# Patient Record
Sex: Female | Born: 1975 | Race: Black or African American | Hispanic: No | Smoking: Never smoker
Health system: Southern US, Community
[De-identification: ages and names within clinical notes are randomized; demographics above are authoritative.]

## PROBLEM LIST (undated history)

## (undated) DIAGNOSIS — R569 Unspecified convulsions: Secondary | ICD-10-CM

---

## 2017-09-24 ENCOUNTER — Emergency Department (HOSPITAL_COMMUNITY)
Admission: EM | Admit: 2017-09-24 | Discharge: 2017-09-24 | Disposition: A | Discharge status: Home / Self Care | Payer: Medicaid Other | Attending: Emergency Medicine | Admitting: Emergency Medicine

## 2017-09-24 ENCOUNTER — Other Ambulatory Visit: Payer: Self-pay

## 2017-09-24 ENCOUNTER — Emergency Department (HOSPITAL_COMMUNITY): Payer: Medicaid Other

## 2017-09-24 ENCOUNTER — Encounter (HOSPITAL_COMMUNITY): Payer: Self-pay | Admitting: Nurse Practitioner

## 2017-09-24 DIAGNOSIS — G40909 Epilepsy, unspecified, not intractable, without status epilepticus: Secondary | ICD-10-CM | POA: Diagnosis not present

## 2017-09-24 DIAGNOSIS — Z9114 Patient's other noncompliance with medication regimen: Secondary | ICD-10-CM

## 2017-09-24 DIAGNOSIS — R569 Unspecified convulsions: Secondary | ICD-10-CM | POA: Insufficient documentation

## 2017-09-24 DIAGNOSIS — R404 Transient alteration of awareness: Secondary | ICD-10-CM | POA: Diagnosis not present

## 2017-09-24 HISTORY — DX: Unspecified convulsions: R56.9

## 2017-09-24 LAB — BASIC METABOLIC PANEL
Anion gap: 11 (ref 5–15)
BUN: 11 mg/dL (ref 6–20)
CHLORIDE: 106 mmol/L (ref 101–111)
CO2: 21 mmol/L — ABNORMAL LOW (ref 22–32)
CREATININE: 0.86 mg/dL (ref 0.44–1.00)
Calcium: 9.2 mg/dL (ref 8.9–10.3)
GFR calc non Af Amer: >60 mL/min (ref >60)
Glucose, Bld: 118 mg/dL — ABNORMAL HIGH (ref 65–99)
POTASSIUM: 4 mmol/L (ref 3.5–5.1)
SODIUM: 138 mmol/L (ref 135–145)

## 2017-09-24 LAB — CBC WITH DIFFERENTIAL/PLATELET
BASOS PCT: 0 %
Basophils Absolute: 0 10*3/uL (ref 0.0–0.1)
EOS ABS: 0 10*3/uL (ref 0.0–0.7)
Eosinophils Relative: 1 %
HCT: 36.9 % (ref 36.0–46.0)
HEMOGLOBIN: 12.3 g/dL (ref 12.0–15.0)
Lymphocytes Relative: 25 %
Lymphs Abs: 1.4 10*3/uL (ref 0.7–4.0)
MCH: 32.9 pg (ref 26.0–34.0)
MCHC: 33.3 g/dL (ref 30.0–36.0)
MCV: 98.7 fL (ref 78.0–100.0)
Monocytes Absolute: 0.3 10*3/uL (ref 0.1–1.0)
Monocytes Relative: 5 %
NEUTROS PCT: 69 %
Neutro Abs: 4 10*3/uL (ref 1.7–7.7)
Platelets: 195 10*3/uL (ref 150–400)
RBC: 3.74 MIL/uL — AB (ref 3.87–5.11)
RDW: 12.7 % (ref 11.5–15.5)
WBC: 5.7 10*3/uL (ref 4.0–10.5)

## 2017-09-24 LAB — I-STAT BETA HCG BLOOD, ED (MC, WL, AP ONLY): I-stat hCG, quantitative: <5 m[IU]/mL (ref <5)

## 2017-09-24 LAB — CBG MONITORING, ED
Glucose-Capillary: 84 mg/dL (ref 65–99)
Glucose-Capillary: 109 mg/dL — ABNORMAL HIGH (ref 65–99)

## 2017-09-24 LAB — ETHANOL: Alcohol, Ethyl (B): <10 mg/dL (ref <10)

## 2017-09-24 LAB — AMMONIA: AMMONIA: 19 umol/L (ref 9–35)

## 2017-09-24 MED ORDER — LEVETIRACETAM 500 MG PO TABS: 1000.0000 mg | ORAL_TABLET | Freq: Once | ORAL | Status: DC

## 2017-09-24 MED ORDER — GADOBENATE DIMEGLUMINE 529 MG/ML IV SOLN
15.0000 mL | Freq: Once | INTRAVENOUS | Status: AC | PRN
  Administered 2017-09-24: 15 mL via INTRAVENOUS

## 2017-09-24 MED ORDER — LORAZEPAM 2 MG/ML IJ SOLN
1.0000 mg | Freq: Once | INTRAMUSCULAR | Status: AC
  Administered 2017-09-24: 1 mg via INTRAMUSCULAR
  Filled 2017-09-24: qty 1

## 2017-09-24 MED ORDER — SODIUM CHLORIDE 0.9 % IV SOLN
1000.0000 mg | Freq: Once | INTRAVENOUS | Status: AC
  Administered 2017-09-24: 1000 mg via INTRAVENOUS
  Filled 2017-09-24: qty 10

## 2017-09-24 MED ORDER — LEVETIRACETAM 1000 MG PO TABS: 1000.0000 mg | ORAL_TABLET | Freq: Two times a day (BID) | ORAL | 2 refills | Status: AC

## 2017-09-24 NOTE — ED Provider Notes (Signed)
[facility]-EMERGENCY DEPT Provider Note   CSN: [id number] Arrival date & time: 09/24/17  0055     History   Chief Complaint Chief Complaint  Patient presents with  . Seizures    HPI Frances Jacobson is a 41 y.o. female.  HPI  This is a 41 year old female with unknown past medical history but reported history of epilepsy who presents following a reported seizure.  EMS provides history.  Patient is nonverbal and will only occasionally shake her head yes and no.  Per EMS, they were called out for witnessed seizure.  Family states she has a history of seizure.  She is not from [place].  No collateral medical records are provided.  Unknown if she is on any medications.  Level 5 caveat.  Past Medical History:  Diagnosis Date  . Seizure (HCC)     There are no active problems to display for this patient.   History reviewed. No pertinent surgical history.  OB History    No data available       Home Medications    Prior to Admission medications   Not on File    Family History History reviewed. No pertinent family history.  Social History Social History   Tobacco Use  . Smoking status: Never Smoker  . Smokeless tobacco: Never Used  Substance Use Topics  . Alcohol use: No    Frequency: Never  . Drug use: No     Allergies   Patient has no known allergies.   Review of Systems Review of Systems  Unable to perform ROS: Patient nonverbal     Physical Exam Updated Vital Signs BP 115/80 (BP Location: Left Arm)   Pulse 71   Temp 98.3 F (36.8 C) (Oral)   Resp 13   Ht 6' (1.829 m)   Wt 70.3 kg (155 lb)   SpO2 99%   BMI 21.02 kg/m   Physical Exam  Constitutional: No distress.  HENT:  Head: Normocephalic and atraumatic.  Eyes: Pupils are equal, round, and reactive to light.  Pupils 6 mm reactive bilaterally  Cardiovascular: Normal rate, regular rhythm and normal heart sounds.  Pulmonary/Chest: Effort normal and breath  sounds normal. No respiratory distress. She has no wheezes.  Abdominal: Soft. There is no tenderness.  Neurological: She is alert.  Nonverbal, cannot assess orientation, symmetric face, spontaneous movement of all 4 extremities noted to be: However, patient will not follow commands and neurologic testing in general is difficult.  Skin: Skin is warm and dry.  Psychiatric: She has a normal mood and affect.  Nursing note and vitals reviewed.    ED Treatments / Results  Labs (all labs ordered are listed, but only abnormal results are displayed) Labs Reviewed  CBC WITH DIFFERENTIAL/PLATELET  BASIC METABOLIC PANEL  AMMONIA  ETHANOL  RAPID URINE DRUG SCREEN, HOSP PERFORMED  CBG MONITORING, ED    EKG  EKG Interpretation None       Radiology Ct Head Wo Contrast  Result Date: 09/24/2017 CLINICAL DATA:  Witnessed 30 second seizures.  History of seizures. EXAM: CT HEAD WITHOUT CONTRAST TECHNIQUE: Contiguous axial images were obtained from the base of the skull through the vertex without intravenous contrast. COMPARISON:  None. FINDINGS: BRAIN: No intraparenchymal hemorrhage, mass effect nor midline shift. Diffuse sulcal effacement. No acute large vascular territory infarcts. No abnormal extra-axial fluid collections. Mildly effaced basal cisterns. VASCULAR: Unremarkable. SKULL/SOFT TISSUES: No skull fracture. No significant soft tissue swelling. ORBITS/SINUSES: The included ocular globes and orbital contents are  normal.Small bilateral maxillary mucosal retention cysts without paranasal sinus air-fluid levels. Mastoid air cells are well aerated. OTHER: None. IMPRESSION: 1. Mild global edema, this may be postictal, hypoxic ischemic encephalopathy, less likely meningitis. 2. Acute findings discussed with and reconfirmed by Dr.COURTNEY HORTON on 09/24/2017 at 5:10 am. Electronically Signed   By: Awilda Metroourtnay  Bloomer M.D.   On: 09/24/2017 05:11    Procedures Procedures (including critical care  time)  Medications Ordered in ED Medications  levETIRAcetam (KEPPRA) 1,000 mg in sodium chloride 0.9 % 100 mL IVPB (not administered)  LORazepam (ATIVAN) injection 1 mg (1 mg Intramuscular Given 09/24/17 0525)     Initial Impression / Assessment and Plan / ED Course  I have reviewed the triage vital signs and the nursing notes.  Pertinent labs & imaging results that were available during my care of the patient were reviewed by me and considered in my medical decision making (see chart for details).  Clinical Course as of Sep 24 605  Wynelle LinkSun Sep 24, 2017  [phone] She continues to be nonverbal.  She will answer her questions with head nod yes or no.  When asked if she had a headache she nodded yes.  CT scan ordered.  [CH]  [phone] Spoke with patient's sister.  At baseline she is normal.  Only reports history of epilepsy.   Unknown medications.  She plans to come at 6 AM.  [CH]  [phone] Spoke with radiology.  CT scan with subtle findings suggestive of potentially edema.  Patient was given IM Ativan and attempts to make her calm enough to allow for labs to be drawn.  [CH]  0549 Discussed with Dr. Otelia LimesLindzen neurology.  Recommends MRI w/wo given CT read and load with keppra while awaiting labs.    [CH]    Clinical Course User Index [CH] Horton, Mayer Maskerourtney F, MD    Patient presents following a reported seizure.  Very limited collateral information provided.  She is nonverbal and is not allowing any labs to be drawn.  Will monitor and reexamine with hopes that this is just postictal.  I did discuss with her sister who provided limited additional history.  On recheck, patient continues to be nonverbal.  Inconsistent with head nods.  CT ordered.  Ativan ordered in an attempt to get further workup.  Unfortunately, CT scan read as abnormal with possible mild edema.  There was not a reported prolonged seizure.  Would not expect abnormalities with a short duration of seizure.  However, her mental status has not improved.   Discussed with Dr. Otelia LimesLindzen as above.  After Ativan, labs were able to be obtained.  See above.  Final Clinical Impressions(s) / ED Diagnoses   Final diagnoses:  Transient alteration of awareness  Seizure [facility](HCC)    ED Discharge Orders    None       Shon BatonHorton, Courtney F, MD 09/24/17 [phone]

## 2017-09-24 NOTE — Discharge Instructions (Signed)
Please call your neurologist in follow-up when you return to [facility]

## 2017-09-24 NOTE — ED Provider Notes (Signed)
10:50 AM Pt awake and alert. Answers simple questions.  Family reports noncompliance with her Keppra.  They will take her by the pharmacy to fill the prescription for Keppra.  MRI without abnormality.  No recurrent seizure in the ER.  Discharged home in good condition.  Patient resides in [place].  I recommended that she follow-up with her neurologist when she returns to [place].  Standard seizure precautions   Frances Jacobson, Frances Dattilo, MD 09/24/17 1051

## 2017-09-24 NOTE — ED Notes (Signed)
Bed: [id number] Expected date:  Expected time:  Means of arrival:  Comments: 41 yo F/Seizure

## 2017-09-24 NOTE — ED Notes (Signed)
Patient back from MRI.

## 2017-09-24 NOTE — ED Triage Notes (Signed)
Pt is presented from home, reportedly had a witnessed seizure that lasted for a bout 30 secs, characterized by urine incontinence and currently somewhat postictal, pt is only responding by hand motions and head nodding. No obvious signs of physical injuries. Medics reported that family indicated pt has a known PMHx of seizures/epilepsy, unsure of the medication she takes, last known episode over an year ago.

## 2017-09-24 NOTE — ED Notes (Signed)
Tried to get blood from patient several times. Patient is fighting and trying to bite.

## 2017-09-24 NOTE — ED Notes (Signed)
Patient transported to X-ray 

## 2017-09-24 NOTE — ED Notes (Signed)
Pt refused CBG.

## 2017-09-24 NOTE — ED Notes (Signed)
Patient transported to MRI 

## 2017-09-28 ENCOUNTER — Emergency Department (HOSPITAL_COMMUNITY)
Admission: EM | Admit: 2017-09-28 | Discharge: 2017-09-28 | Disposition: A | Discharge status: Home / Self Care | Payer: Medicaid Other | Attending: Emergency Medicine | Admitting: Emergency Medicine

## 2017-09-28 ENCOUNTER — Other Ambulatory Visit: Payer: Self-pay

## 2017-09-28 ENCOUNTER — Encounter (HOSPITAL_COMMUNITY): Payer: Self-pay | Admitting: Emergency Medicine

## 2017-09-28 DIAGNOSIS — Z79899 Other long term (current) drug therapy: Secondary | ICD-10-CM | POA: Insufficient documentation

## 2017-09-28 DIAGNOSIS — G40909 Epilepsy, unspecified, not intractable, without status epilepticus: Secondary | ICD-10-CM | POA: Insufficient documentation

## 2017-09-28 DIAGNOSIS — R569 Unspecified convulsions: Secondary | ICD-10-CM | POA: Diagnosis present

## 2017-09-28 LAB — I-STAT BETA HCG BLOOD, ED (MC, WL, AP ONLY)

## 2017-09-28 LAB — CBC WITH DIFFERENTIAL/PLATELET
BASOS ABS: 0 10*3/uL (ref 0.0–0.1)
BASOS PCT: 0 %
Eosinophils Absolute: 0.1 10*3/uL (ref 0.0–0.7)
Eosinophils Relative: 1 %
HEMATOCRIT: 40.2 % (ref 36.0–46.0)
Hemoglobin: 13.4 g/dL (ref 12.0–15.0)
LYMPHS PCT: 42 %
Lymphs Abs: 4.3 10*3/uL — ABNORMAL HIGH (ref 0.7–4.0)
MCH: 33.8 pg (ref 26.0–34.0)
MCHC: 33.3 g/dL (ref 30.0–36.0)
MCV: 101.3 fL — ABNORMAL HIGH (ref 78.0–100.0)
Monocytes Absolute: 0.8 10*3/uL (ref 0.1–1.0)
Monocytes Relative: 8 %
NEUTROS ABS: 5.1 10*3/uL (ref 1.7–7.7)
NEUTROS PCT: 49 %
Platelets: 246 10*3/uL (ref 150–400)
RBC: 3.97 MIL/uL (ref 3.87–5.11)
RDW: 12.8 % (ref 11.5–15.5)
WBC: 10.3 10*3/uL (ref 4.0–10.5)

## 2017-09-28 LAB — I-STAT CHEM 8, ED
BUN: 11 mg/dL (ref 6–20)
CHLORIDE: 108 mmol/L (ref 101–111)
CREATININE: 0.8 mg/dL (ref 0.44–1.00)
Calcium, Ion: 1.28 mmol/L (ref 1.15–1.40)
Glucose, Bld: 130 mg/dL — ABNORMAL HIGH (ref 65–99)
HEMATOCRIT: 43 % (ref 36.0–46.0)
HEMOGLOBIN: 14.6 g/dL (ref 12.0–15.0)
POTASSIUM: 4.7 mmol/L (ref 3.5–5.1)
Sodium: 144 mmol/L (ref 135–145)
TCO2: 26 mmol/L (ref 22–32)

## 2017-09-28 MED ORDER — SODIUM CHLORIDE 0.9 % IV SOLN
1000.0000 mg | Freq: Once | INTRAVENOUS | Status: AC
  Administered 2017-09-28: 1000 mg via INTRAVENOUS
  Filled 2017-09-28: qty 10

## 2017-09-28 MED ORDER — LEVETIRACETAM 1000 MG PO TABS: 1000.0000 mg | ORAL_TABLET | Freq: Two times a day (BID) | ORAL | 0 refills | Status: AC

## 2017-09-28 MED ORDER — LORAZEPAM 2 MG/ML IJ SOLN: 0.5000 mg | Freq: Once | INTRAMUSCULAR | Status: DC

## 2017-09-28 MED ORDER — LORAZEPAM 2 MG/ML IJ SOLN
0.5000 mg | Freq: Once | INTRAMUSCULAR | Status: AC
  Administered 2017-09-28: 0.5 mg via INTRAMUSCULAR
  Filled 2017-09-28: qty 1

## 2017-09-28 NOTE — ED Notes (Signed)
Pt ambulated to bathroom with assistance.

## 2017-09-28 NOTE — ED Notes (Signed)
Patient refusing iv start and blood draw. Pt lunges towards nurse when trying to stick pt. Pt remains non-verbal. Provider notified.

## 2017-09-28 NOTE — ED Triage Notes (Signed)
Patient BIB [facility] from home. Pt had seizure in bed, woke person next to her. Pt had episode of incontinence. Pt postictal upon EMS arrival, pt able to ambulate slowly but is not speaking. Hx of epilepsy, non-compliant with medications, seen for same on the 11th. Pt refused to allow EMS to start IV on pt, EMS stated she withdrew or became combative whenever they approached her with a sharp object.

## 2017-09-28 NOTE — ED Provider Notes (Addendum)
[facility]-EMERGENCY DEPT Provider Note   CSN: [id number] Arrival date & time: 09/28/17  0145     History   Chief Complaint Chief Complaint  Patient presents with  . Seizures    HPI Frances Jacobson is a 41 y.o. female.  The history is provided by the EMS personnel. The history is limited by the condition of the patient.  Seizures   This is a recurrent problem. The current episode started 3 to 5 hours ago. The problem has been resolved. There were 4 to 5 seizures. Associated symptoms include confusion. Characteristics include bowel incontinence and rhythmic jerking. There was no sensation of an aura present. The seizure(s) had no focality. Possible causes include missed seizure meds. There has been no fever. There were no medications administered prior to arrival.  Patient with h/o epilepsy and medication non compliance on her keppra presents with seizures.    Past Medical History:  Diagnosis Date  . Seizure (HCC)     There are no active problems to display for this patient.   History reviewed. No pertinent surgical history.  OB History    No data available       Home Medications    Prior to Admission medications   Medication Sig Start Date End Date Taking? Authorizing Provider  diazepam (VALIUM) 5 MG tablet Take 5 mg every 6 (six) hours as needed by mouth for anxiety.  09/12/17   [provider]  gabapentin (NEURONTIN) 300 MG capsule Take 600 mg 3 (three) times daily by mouth. 08/16/17   [provider]  ibuprofen (ADVIL,MOTRIN) 800 MG tablet Take 800 mg every 8 (eight) hours as needed by mouth for moderate pain.  09/12/17   [provider]  levETIRAcetam (KEPPRA) 1000 MG tablet Take 1 tablet (1,000 mg total) 2 (two) times daily by mouth. Patient not taking: Reported on 09/28/2017 09/24/17 10/24/17  Azalia Bilisampos, Kevin, MD  topiramate (TOPAMAX) 100 MG tablet Take 100 mg 2 (two) times daily by mouth. 08/16/17   [provider]    Family History History reviewed. No pertinent family history.  Social History Social History   Tobacco Use  . Smoking status: Never Smoker  . Smokeless tobacco: Never Used  Substance Use Topics  . Alcohol use: No    Frequency: Never  . Drug use: No     Allergies   Patient has no known allergies.   Review of Systems Review of Systems  Unable to perform ROS: Other  Gastrointestinal: Positive for bowel incontinence.  Neurological: Positive for seizures.  Psychiatric/Behavioral: Positive for confusion.     Physical Exam Updated Vital Signs BP 124/60   Pulse (!) 114   Resp 18   SpO2 99%   Physical Exam  Constitutional: She appears well-developed and well-nourished. No distress.  HENT:  Head: Normocephalic and atraumatic.  Right Ear: External ear normal.  Left Ear: External ear normal.  Nose: Nose normal.  Mouth/Throat: Oropharynx is clear and moist. No oropharyngeal exudate.  Eyes: Conjunctivae are normal. Pupils are equal, round, and reactive to light.  Neck: Normal range of motion. Neck supple. No JVD present.  Cardiovascular: Normal rate, regular rhythm, normal heart sounds and intact distal pulses.  Pulmonary/Chest: Effort normal and breath sounds normal. No stridor. She has no wheezes. She has no rales.  Abdominal: Soft. Bowel sounds are normal. She exhibits no mass. There is no tenderness. There is no rebound and no guarding.  Musculoskeletal: Normal range of motion.  Neurological: She is alert.  She displays normal reflexes.  Skin: Skin is warm and dry. Capillary refill takes less than 2 seconds.  Psychiatric:  Unable      ED Treatments / Results  Labs (all labs ordered are listed, but only abnormal results are displayed)  Results for orders placed or performed during the hospital encounter of 09/28/17  I-Stat Chem 8, ED  Result Value Ref Range   Sodium 144 135 - 145 mmol/L   Potassium 4.7 3.5 - 5.1 mmol/L   Chloride 108 101 -  111 mmol/L   BUN 11 6 - 20 mg/dL   Creatinine, Ser 1.61 0.44 - 1.00 mg/dL   Glucose, Bld 096 (H) 65 - 99 mg/dL   Calcium, Ion 0.45 4.09 - 1.40 mmol/L   TCO2 26 22 - 32 mmol/L   Hemoglobin 14.6 12.0 - 15.0 g/dL   HCT 81.1 91.4 - 78.2 %  I-Stat Beta hCG blood, ED ([facility], [facility], [facility] only)  Result Value Ref Range   I-stat hCG, quantitative <5.0 <5 mIU/mL   Comment 3           Dg Chest 2 View  Result Date: 09/24/2017 CLINICAL DATA:  Seizure EXAM: CHEST  2 VIEW COMPARISON:  None. FINDINGS: The lungs are clear. Heart size and pulmonary vascularity are normal. No adenopathy. No pneumothorax. No bone lesions. No radiopaque foreign body evident. IMPRESSION: No edema or consolidation.  No evident radiopaque foreign body. Electronically Signed   By: Bretta Bang III M.D.   On: 09/24/2017 08:54   Ct Head Wo Contrast  Result Date: 09/24/2017 CLINICAL DATA:  Witnessed 30 second seizures.  History of seizures. EXAM: CT HEAD WITHOUT CONTRAST TECHNIQUE: Contiguous axial images were obtained from the base of the skull through the vertex without intravenous contrast. COMPARISON:  None. FINDINGS: BRAIN: No intraparenchymal hemorrhage, mass effect nor midline shift. Diffuse sulcal effacement. No acute large vascular territory infarcts. No abnormal extra-axial fluid collections. Mildly effaced basal cisterns. VASCULAR: Unremarkable. SKULL/SOFT TISSUES: No skull fracture. No significant soft tissue swelling. ORBITS/SINUSES: The included ocular globes and orbital contents are normal.Small bilateral maxillary mucosal retention cysts without paranasal sinus air-fluid levels. Mastoid air cells are well aerated. OTHER: None. IMPRESSION: 1. Mild global edema, this may be postictal, hypoxic ischemic encephalopathy, less likely meningitis. 2. Acute findings discussed with and reconfirmed by Dr.COURTNEY HORTON on 09/24/2017 at 5:10 am. Electronically Signed   By: Awilda Metro M.D.   On: 09/24/2017 05:11   Mr Laqueta Jean And Wo  Contrast  Result Date: 09/24/2017 CLINICAL DATA:  Altered level of consciousness unexplained. Abnormal CT EXAM: MRI HEAD WITHOUT AND WITH CONTRAST TECHNIQUE: Multiplanar, multiecho pulse sequences of the brain and surrounding structures were obtained without and with intravenous contrast. CONTRAST:  15 mL MultiHance IV COMPARISON:  CT head 09/24/2017 FINDINGS: Brain: No acute infarction, hemorrhage, hydrocephalus, extra-axial collection or mass lesion. Normal enhancement following contrast administration. Negative for cerebral edema. Vascular: Normal arterial flow voids Skull and upper cervical spine: Negative Sinuses/Orbits: Negative Other: None IMPRESSION: Normal MRI brain with contrast.  Negative for cerebral edema. Electronically Signed   By: Marlan Palau M.D.   On: 09/24/2017 10:33     Procedures Procedures (including critical care time)  Medications Ordered in ED Medications  levETIRAcetam (KEPPRA) 1,000 mg in sodium chloride 0.9 % 100 mL IVPB (0 mg Intravenous Stopped 09/28/17 0410)  LORazepam (ATIVAN) injection 0.5 mg (0.5 mg Intramuscular Given 09/28/17 0335)       Final Clinical Impressions(s) / ED Diagnoses   Awake  alert and talking and has a ride home with family.    All questions answered to the patient's satisfaction.   Strict return precautions for swelling or the lips or tongue, chest pain, dyspnea on exertion, new weakness or numbness changes in vision or speech, fevers, weakness persistent pain, Inability to tolerate liquids or food, changes in voice cough, altered mental status or any concerns. No signs of systemic illness or infection. The patient is nontoxic-appearing on exam and vital signs are within normal limits.    I have reviewed the triage vital signs and the nursing notes. Pertinent labs &imaging results that were available during my care of the patient were reviewed by me and considered in my medical decision making (see chart for details).  After  history, exam, and medical workup I feel the patient has been appropriately medically screened and is safe for discharge home. Pertinent diagnoses were discussed with the patient. Patient was given return precautions   Jenella Craigie, MD 09/28/17 [phone]

## 2017-09-28 NOTE — ED Notes (Signed)
Pt spoke to sister on phone. Pt's sister is to pick up the pt around 10AM.

## 2017-09-28 NOTE — Discharge Instructions (Signed)
No driving until cleared by neurology.  This is the law.  Take your Keppra as directed

## 2019-04-08 IMAGING — MR MR HEAD WO/W CM
10 of 14 series · 33 of 48 positions shown · IV contrast (multihance)
Comparison: CT head 09/24/2017

CLINICAL DATA: Altered level of consciousness unexplained. Abnormal
CT

EXAM:
MRI HEAD WITHOUT AND WITH CONTRAST
TECHNIQUE: Multiplanar, multiecho pulse sequences of the brain and surrounding
structures were obtained without and with intravenous contrast.
CONTRAST:  15 mL MultiHance IV

[Series 3: DWI · axial · 4.0mm · 1.17mm/px · z∈[-67,+95]mm · 7 of 74 slices shown (1 of 4)]
[im 1/74]
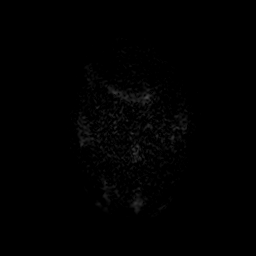
[im 13/74]
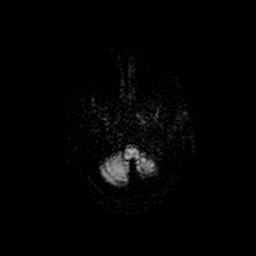
[im 25/74]
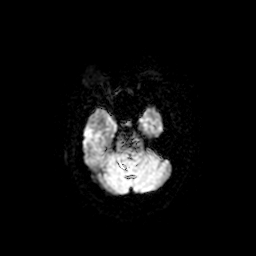
[im 37/74]
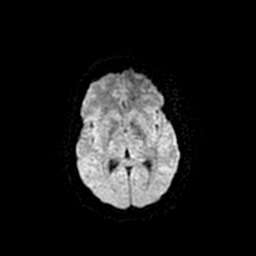
[im 49/74]
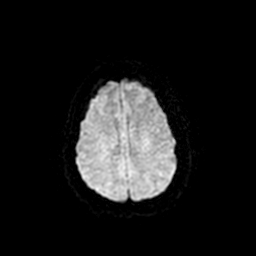
[im 61/74]
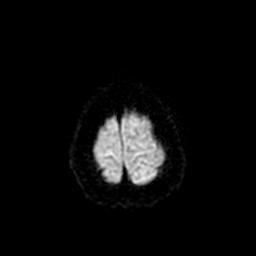
[im 74/74]
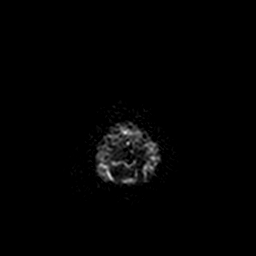

[Series 5: DWI · coronal · 4.0mm · 1.17mm/px · 7 of 84 slices shown (2 of 4)]
[im 1/84]
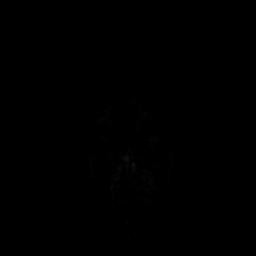
[im 14/84]
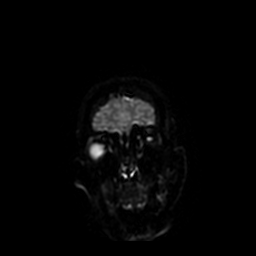
[im 28/84]
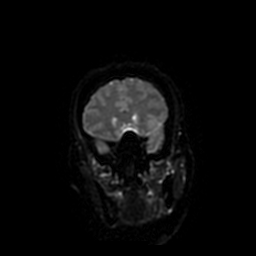
[im 42/84]
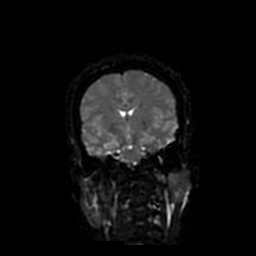
[im 56/84]
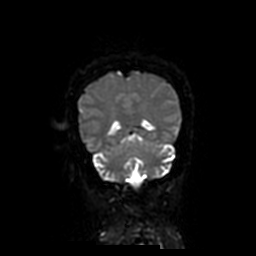
[im 70/84]
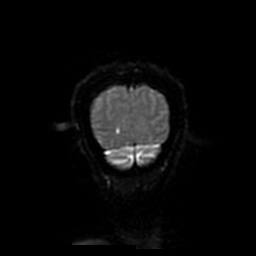
[im 84/84]
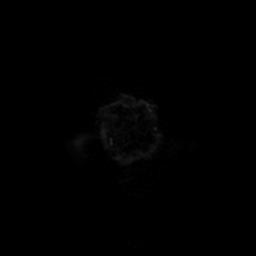

[Series 6: T2 · axial · 5.0mm · 0.47mm/px · z∈[-65,+97]mm · 2 of 28 slices shown (1 of 2)]
[im 1/28]
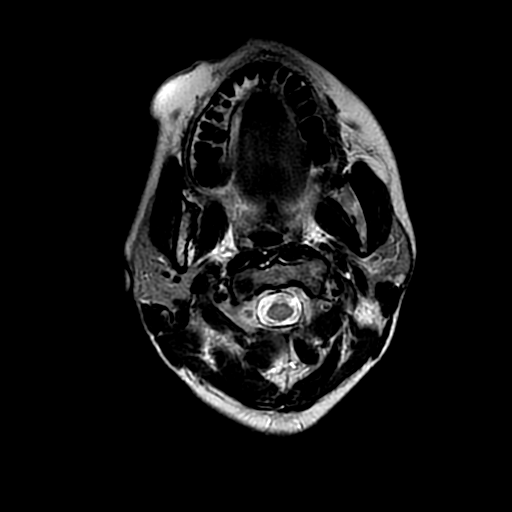
[im 28/28]
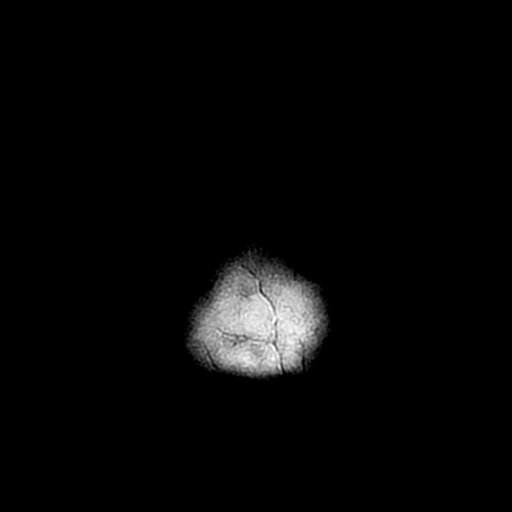

[Series 7: FLAIR · axial · 5.0mm · 0.47mm/px · z∈[-65,+97]mm · 2 of 28 slices shown]
[im 1/28]
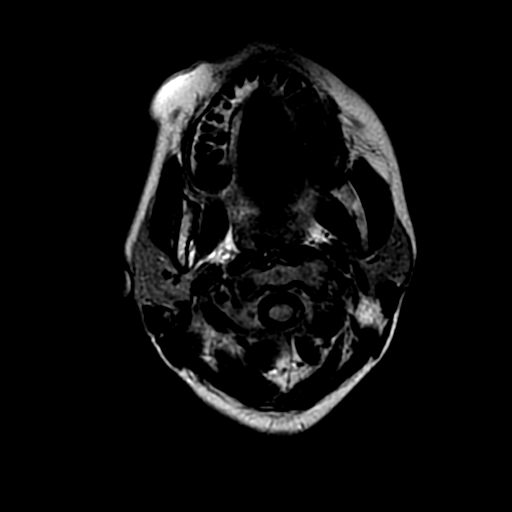
[im 28/28]
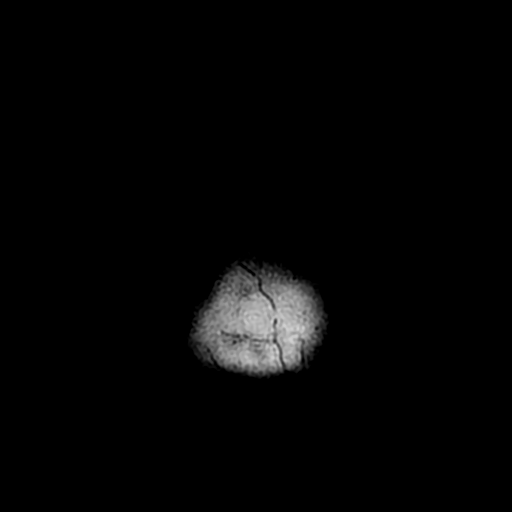

[Series 8: T2 · coronal · 3.0mm · 0.39mm/px · 3 of 41 slices shown (2 of 2)]
[im 1/41]
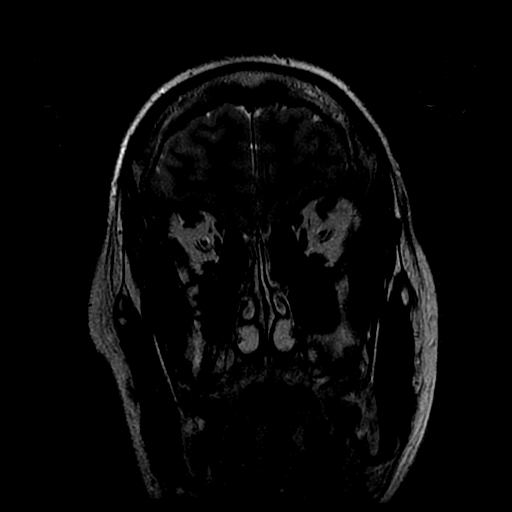
[im 21/41]
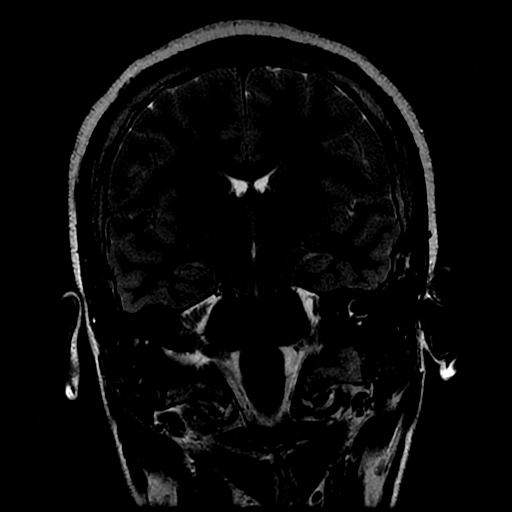
[im 41/41]
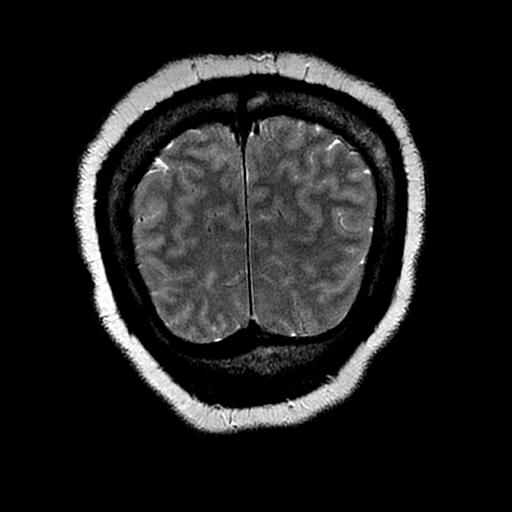

[Series 11: T2 post-contrast · coronal · 5.0mm · 0.45mm/px · 2 of 30 slices shown]
[im 1/30]
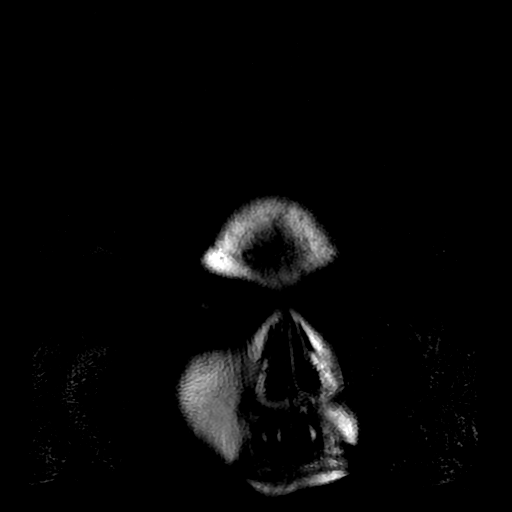
[im 30/30]
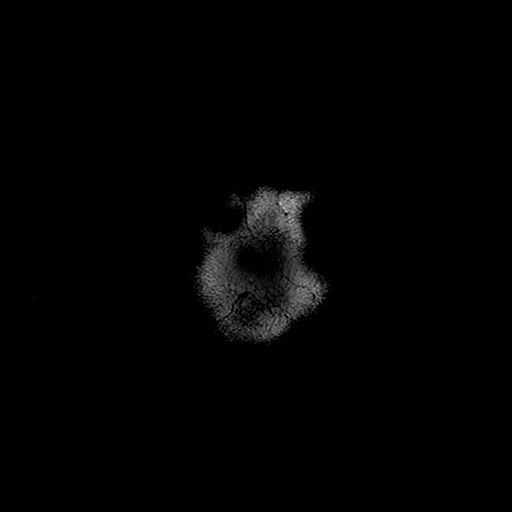

[Series 13: T1 post-contrast · coronal · 5.0mm · 0.45mm/px · 2 of 30 slices shown (1 of 2)]
[im 1/30]
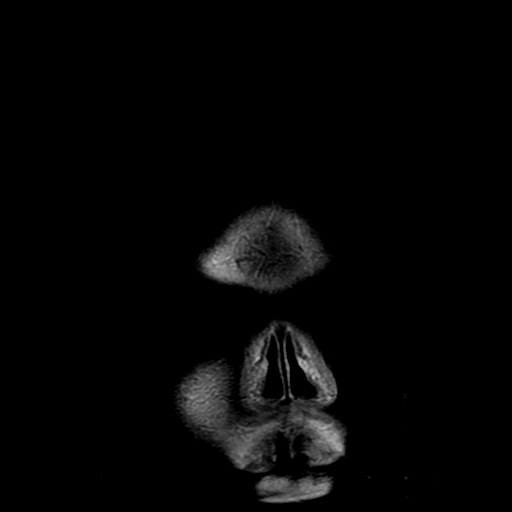
[im 30/30]
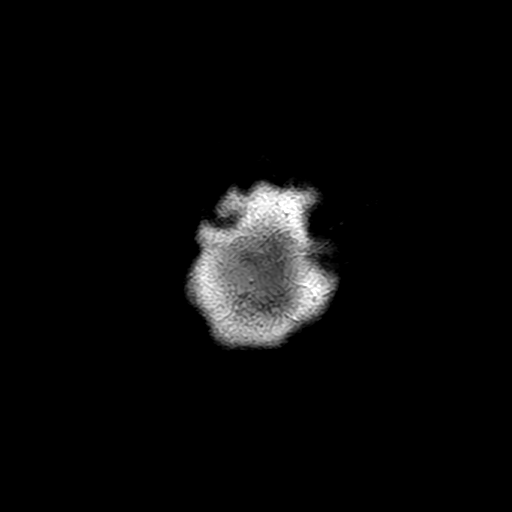

[Series 14: T1 post-contrast · sagittal · 5.0mm · 0.47mm/px · 2 of 24 slices shown (2 of 2)]
[im 1/24]
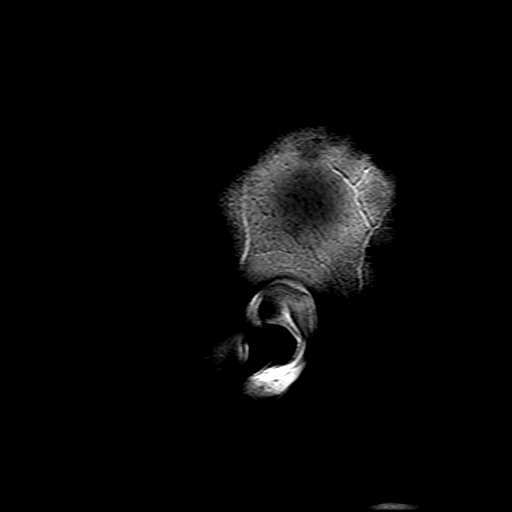
[im 24/24]
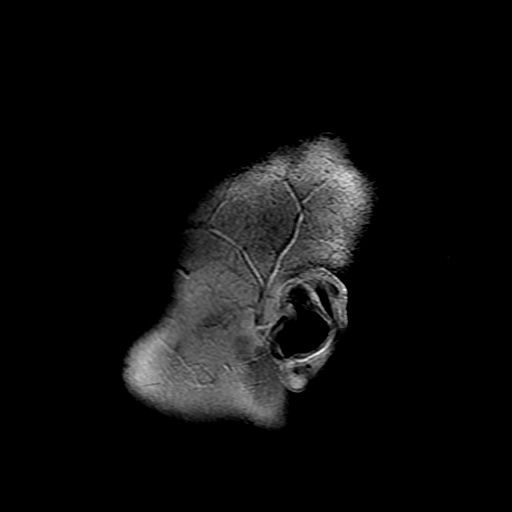

[Series 300: DWI · axial · 4.0mm · 1.17mm/px · z∈[-67,+95]mm · 3 of 37 slices shown (3 of 4)]
[im 1/37]
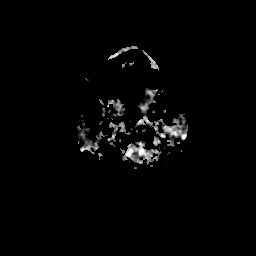
[im 19/37]
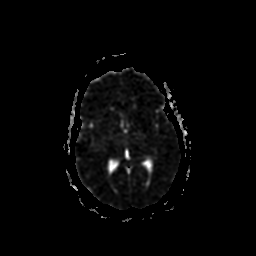
[im 37/37]
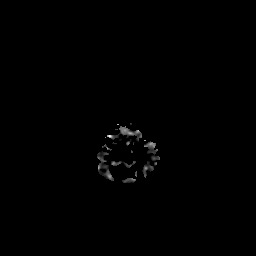

[Series 500: DWI · coronal · 4.0mm · 1.17mm/px · 3 of 42 slices shown (4 of 4)]
[im 1/42]
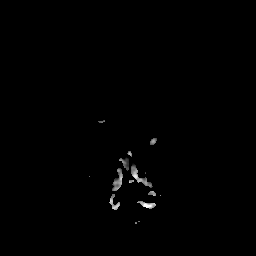
[im 21/42]
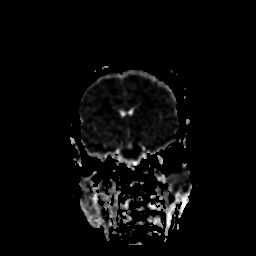
[im 42/42]
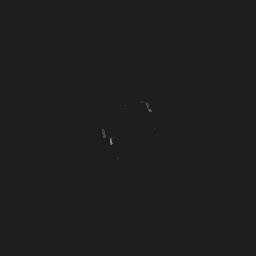

[33 of 48 positions shown; findings below may reference images not displayed]

FINDINGS: Brain: No acute infarction, hemorrhage, hydrocephalus, extra-axial
collection or mass lesion. Normal enhancement following contrast
administration.

Negative for cerebral edema.

Vascular: Normal arterial flow voids

Skull and upper cervical spine: Negative

Sinuses/Orbits: Negative

Other: None
IMPRESSION: Normal MRI brain with contrast.  Negative for cerebral edema.
